# Patient Record
Sex: Male | Born: 1995 | Race: Black or African American | Hispanic: No | Marital: Single | Smoking: Never smoker
Health system: Southern US, Community
[De-identification: ages and names within clinical notes are randomized; demographics above are authoritative.]

## PROBLEM LIST (undated history)

## (undated) HISTORY — PX: KNEE SURGERY: SHX244

---

## 2016-01-20 ENCOUNTER — Emergency Department (HOSPITAL_COMMUNITY)
Admission: EM | Admit: 2016-01-20 | Discharge: 2016-01-20 | Disposition: A | Discharge status: Home / Self Care | Attending: Emergency Medicine | Admitting: Emergency Medicine

## 2016-01-20 ENCOUNTER — Encounter (HOSPITAL_COMMUNITY): Payer: Self-pay | Admitting: *Deleted

## 2016-01-20 ENCOUNTER — Emergency Department (HOSPITAL_COMMUNITY)

## 2016-01-20 DIAGNOSIS — Y999 Unspecified external cause status: Secondary | ICD-10-CM | POA: Diagnosis not present

## 2016-01-20 DIAGNOSIS — Y9361 Activity, american tackle football: Secondary | ICD-10-CM | POA: Insufficient documentation

## 2016-01-20 DIAGNOSIS — S8991XA Unspecified injury of right lower leg, initial encounter: Secondary | ICD-10-CM | POA: Diagnosis not present

## 2016-01-20 DIAGNOSIS — X509XXA Other and unspecified overexertion or strenuous movements or postures, initial encounter: Secondary | ICD-10-CM | POA: Insufficient documentation

## 2016-01-20 DIAGNOSIS — Y929 Unspecified place or not applicable: Secondary | ICD-10-CM | POA: Insufficient documentation

## 2016-01-20 MED ORDER — DICLOFENAC SODIUM 50 MG PO TBEC: 50.0000 mg | DELAYED_RELEASE_TABLET | Freq: Two times a day (BID) | ORAL | 0 refills | Status: AC

## 2016-01-20 MED ORDER — HYDROCODONE-ACETAMINOPHEN 5-325 MG PO TABS: 1.0000 | ORAL_TABLET | Freq: Four times a day (QID) | ORAL | 0 refills | Status: AC | PRN

## 2016-01-20 MED ORDER — HYDROCODONE-ACETAMINOPHEN 5-325 MG PO TABS
1.0000 | ORAL_TABLET | Freq: Once | ORAL | Status: AC
  Administered 2016-01-20: 1 via ORAL
  Filled 2016-01-20: qty 1

## 2016-01-20 NOTE — ED Notes (Signed)
Pt stable, ambulatory, states understanding of discharge instructions 

## 2016-01-20 NOTE — ED Provider Notes (Signed)
[facility]-EMERGENCY DEPT Provider Note   CSN: [id number] Arrival date & time: 01/20/16  2034  By signing my name below, I, Phillis HaggisGabriella Gaje, attest that this documentation has been prepared under the direction and in the presence of [facility] Neese, NP-C. Electronically Signed: Phillis HaggisGabriella Gaje, ED Scribe. 01/20/16. 9:12 PM.  History   Chief Complaint Chief Complaint  Patient presents with  . Knee Pain   The history is provided by the patient. No language interpreter was used.  Knee Pain   This is a new problem. The current episode started 3 to 5 hours ago. The problem occurs constantly. The problem has been gradually worsening. The pain is present in the right knee. The pain is mild. Associated symptoms include limited range of motion. Pertinent negatives include no numbness. He has tried nothing for the symptoms.   HPI Comments: Kyle Holt is a 20 y.o. male who presents to the Emergency Department complaining of right knee pain onset earlier today. Pt says that he "did a spin move" while playing football and felt a pop. He is unable to bear weight on the leg. He reports recent meniscus and ACL tears of the right knee. He denies color change, wound, numbness, or weakness.  History reviewed. No pertinent past medical history.  There are no active problems to display for this patient.   History reviewed. No pertinent surgical history.    Home Medications    Prior to Admission medications   Medication Sig Start Date End Date Taking? Authorizing Provider  diclofenac (VOLTAREN) 50 MG EC tablet Take 1 tablet (50 mg total) by mouth 2 (two) times daily. 01/20/16   Hope Orlene OchM Neese, NP  HYDROcodone-acetaminophen (NORCO) 5-325 MG tablet Take 1 tablet by mouth every 6 (six) hours as needed. 01/20/16   Hope Orlene OchM Neese, NP    Family History No family history on file.  Social History Social History  Substance Use Topics  . Smoking status: Never Smoker  . Smokeless tobacco: Never Used  . Alcohol use No       Allergies   Patient has no known allergies.   Review of Systems Review of Systems  Musculoskeletal: Positive for arthralgias and joint swelling.  Skin: Negative for color change and wound.  Neurological: Negative for weakness and numbness.   Physical Exam Updated Vital Signs BP 115/62 (BP Location: Right Arm)   Pulse (!) 59   Temp 97.7 F (36.5 C)   Resp 17   Ht 6' (1.829 m)   Wt 68 kg   SpO2 100%   BMI 20.34 kg/m   Physical Exam  Constitutional: He is oriented to person, place, and time. He appears well-developed and well-nourished. No distress.  HENT:  Head: Normocephalic and atraumatic.  Mouth/Throat: Oropharynx is clear and moist. No oropharyngeal exudate.  Eyes: Conjunctivae and EOM are normal. Pupils are equal, round, and reactive to light.  Neck: Normal range of motion. Neck supple.  Musculoskeletal:  Distal pulses are 2+; high riding patella noted to right knee  Neurological: He is alert and oriented to person, place, and time.  Skin: Skin is warm and dry.  Psychiatric: He has a normal mood and affect. His behavior is normal.   ED Treatments / Results  DIAGNOSTIC STUDIES: Oxygen Saturation is 97% on RA, normal by my interpretation.    COORDINATION OF CARE: 9:09 PM-Discussed treatment plan which includes x-ray with pt at bedside and pt agreed to plan.    Labs (all labs ordered are listed, but only abnormal results  are displayed) Labs Reviewed - No data to display  EKG  EKG Interpretation None       Radiology Dg Knee Complete 4 Views Right  Result Date: 01/20/2016 CLINICAL DATA:  Injury while playing football, with pop at the right knee. Unable to bear weight at the right knee. Recent meniscal tear and ACL tear. Initial encounter. EXAM: RIGHT KNEE - COMPLETE 4+ VIEW COMPARISON:  None. FINDINGS: There is no evidence of fracture or dislocation. The joint spaces are preserved. No significant degenerative change is seen; the patellofemoral joint is  grossly unremarkable in appearance. No significant joint effusion is seen. The visualized soft tissues are normal in appearance. IMPRESSION: No evidence of fracture or dislocation. Given the patient's symptoms and presentation, MRI would be helpful for further evaluation, to assess for underlying internal derangement. Electronically Signed   By: Roanna RaiderJeffery  Chang M.D.   On: 01/20/2016 21:54    Procedures Procedures (including critical care time)  Medications Ordered in ED Medications  HYDROcodone-acetaminophen (NORCO/VICODIN) 5-325 MG per tablet 1 tablet (1 tablet Oral Given 01/20/16 2116)     Initial Impression / Assessment and Plan / ED Course  I have reviewed the triage vital signs and the nursing notes.  Pertinent imaging results that were available during my care of the patient were reviewed by me and considered in my medical decision making (see chart for details).  Clinical Course   Dr. Effie ShyWentz in to examine the patient. Will apply ace wrap, knee immobilizer and patient will use crutches and f/u with ortho ASAP.   Patient X-Ray negative for obvious fracture or dislocation. Pain managed in ED. Pt advised to follow up with orthopedics. Referral given. Patient given brace while in ED, conservative therapy recommended and discussed. Patient will be dc home & is agreeable with above plan.  Final Clinical Impressions(s) / ED Diagnoses   Final diagnoses:  Knee injury, right, initial encounter  I personally performed the services described in this documentation, which was scribed in my presence. The recorded information has been reviewed and is accurate.   New Prescriptions Discharge Medication List as of 01/20/2016 10:25 PM    START taking these medications   Details  diclofenac (VOLTAREN) 50 MG EC tablet Take 1 tablet (50 mg total) by mouth 2 (two) times daily., Starting Thu 01/20/2016, Print    HYDROcodone-acetaminophen [facility](NORCO) 5-325 MG tablet Take 1 tablet by mouth every 6 (six)  hours as needed., Starting Thu 01/20/2016, Print         ParksHope M Neese, NP 01/21/16 1636    Mancel BaleElliott Wentz, MD 01/21/16 2316

## 2016-01-20 NOTE — ED Triage Notes (Signed)
Pt reports a recent meniscus and ACL tear to R knee. Pt was playing football today, did a "spin move," felt a pop and has been unable to bear weight to R knee since incident.

## 2016-01-21 NOTE — ED Provider Notes (Signed)
  Face-to-face evaluation   History: Right after planted pivot in ball game  Physical exam: Resists right knee extension, secondary to pain. Somewhat soft but palpable patellar tendon. Right knee is grossly stable.  Medical screening examination/treatment/procedure(s) were conducted as a shared visit with non-physician practitioner(s) and myself.  I personally evaluated the patient during the encounter   Mancel BaleElliott Jovany Disano, MD 01/21/16 2316

## 2016-02-02 ENCOUNTER — Other Ambulatory Visit: Payer: Self-pay | Admitting: Orthopaedic Surgery

## 2016-02-02 DIAGNOSIS — M25561 Pain in right knee: Secondary | ICD-10-CM

## 2016-02-06 ENCOUNTER — Ambulatory Visit
Admission: RE | Admit: 2016-02-06 | Discharge: 2016-02-06 | Disposition: A | Discharge status: Home / Self Care | Source: Ambulatory Visit | Attending: Orthopaedic Surgery | Admitting: Orthopaedic Surgery

## 2016-02-06 DIAGNOSIS — M25561 Pain in right knee: Secondary | ICD-10-CM

## 2016-07-06 ENCOUNTER — Encounter (HOSPITAL_COMMUNITY): Payer: Self-pay | Admitting: *Deleted

## 2016-07-06 DIAGNOSIS — S6991XA Unspecified injury of right wrist, hand and finger(s), initial encounter: Secondary | ICD-10-CM | POA: Diagnosis present

## 2016-07-06 DIAGNOSIS — Z79899 Other long term (current) drug therapy: Secondary | ICD-10-CM | POA: Diagnosis not present

## 2016-07-06 DIAGNOSIS — Y939 Activity, unspecified: Secondary | ICD-10-CM | POA: Diagnosis not present

## 2016-07-06 DIAGNOSIS — Y999 Unspecified external cause status: Secondary | ICD-10-CM | POA: Insufficient documentation

## 2016-07-06 DIAGNOSIS — W260XXA Contact with knife, initial encounter: Secondary | ICD-10-CM | POA: Diagnosis not present

## 2016-07-06 DIAGNOSIS — Y929 Unspecified place or not applicable: Secondary | ICD-10-CM | POA: Insufficient documentation

## 2016-07-06 DIAGNOSIS — S61212A Laceration without foreign body of right middle finger without damage to nail, initial encounter: Secondary | ICD-10-CM | POA: Diagnosis not present

## 2016-07-06 NOTE — ED Triage Notes (Signed)
Pt was cutting a sandwich with a kitchen knife and cut the pad of his right hand third finger. Pressure dressing applied in triage.

## 2016-07-07 ENCOUNTER — Emergency Department (HOSPITAL_COMMUNITY)
Admission: EM | Admit: 2016-07-07 | Discharge: 2016-07-07 | Disposition: A | Discharge status: Home / Self Care | Attending: Emergency Medicine | Admitting: Emergency Medicine

## 2016-07-07 DIAGNOSIS — S61212A Laceration without foreign body of right middle finger without damage to nail, initial encounter: Secondary | ICD-10-CM

## 2016-07-07 MED ORDER — LIDOCAINE HCL (PF) 1 % IJ SOLN
2.0000 mL | Freq: Once | INTRAMUSCULAR | Status: AC
  Administered 2016-07-07: 2 mL
  Filled 2016-07-07: qty 5

## 2016-07-07 NOTE — ED Provider Notes (Signed)
[facility]EM[facility]Y DEPT Provider Note   CSN: [id number] Arrival date & time: 07/06/16  2333     History   Chief Complaint Chief Complaint  Patient presents with  . Extremity Laceration    HPI Kyle Holt is a 21 y.o. male who presents to the ED with a laceration to the right middle finger approximately 3 hours ago. He reports he was using a knife to prepare something to eat and it slipped causing the laceration. UTD on tetanus.   HPI  History reviewed. No pertinent past medical history.  There are no active problems to display for this patient.   Past Surgical History:  Procedure Laterality Date  . KNEE SURGERY         Home Medications    Prior to Admission medications   Medication Sig Start Date End Date Taking? Authorizing Provider  diclofenac (VOLTAREN) 50 MG EC tablet Take 1 tablet (50 mg total) by mouth 2 (two) times daily. 01/20/16   D[place] Orlene Och, NP  HYDROcodone-acetaminophen (NORCO) 5-325 MG tablet Take 1 tablet by mouth every 6 (six) hours as needed. 01/20/16   Francheska Villeda Orlene Och, NP    Family History No family history on file.  Social History Social History  Substance Use Topics  . Smoking status: Never Smoker  . Smokeless tobacco: Never Used  . Alcohol use No     Allergies   Patient has no known allergies.   Review of Systems Review of Systems  Musculoskeletal: Positive for arthralgias.       Right middle finger  Skin: Positive for wound.  Neurological: Negative for syncope, weakness and numbness.     Physical Exam Updated Vital Signs BP 127/64 (BP Location: Left Arm)   Pulse 80   Temp 98.4 F (36.9 C) (Oral)   Resp 16   SpO2 98%   Physical Exam  Constitutional: He is oriented to person, place, and time. He appears well-developed and well-nourished. No distress.  HENT:  Head: Normocephalic.  Eyes: EOM are normal.  Neck: Neck supple.  Cardiovascular: Normal rate.   Pulmonary/Chest: Effort normal.  Musculoskeletal: Normal range of  motion.  See skin exam  Neurological: He is alert and oriented to person, place, and time. No cranial nerve deficit.  Skin: Skin is warm and dry.  Laceration to the right middle finger without nail involvement. Full range of motion, good strength and sensation.   Psychiatric: He has a normal mood and affect. His behavior is normal.  Nursing note and vitals reviewed.    ED Treatments / Results  Labs (all labs ordered are listed, but only abnormal results are displayed) Labs Reviewed - No data to display   Radiology No results found.  Procedures .Marland KitchenLaceration Repair Date/Time: 07/07/2016 1:22 AM Performed by: Janne Napoleon Authorized by: Janne Napoleon   Consent:    Consent obtained:  Verbal   Consent given by:  Patient   Risks discussed:  Infection and poor wound healing   Alternatives discussed:  No treatment Anesthesia (see MAR for exact dosages):    Anesthesia method:  Local infiltration   Local anesthetic:  Lidocaine 1% w/o epi Laceration details:    Location:  Finger   Finger location:  R long finger   Length (cm):  2 Repair type:    Repair type:  Simple Pre-procedure details:    Preparation:  Patient was prepped and draped in usual sterile fashion and imaging obtained to evaluate for foreign bodies Exploration:    Hemostasis achieved with:  Direct pressure   Contaminated: no   Treatment:    Area cleansed with:  Betadine   Amount of cleaning:  Standard   Irrigation solution:  Sterile saline   Irrigation method:  Syringe Skin repair:    Repair method:  Sutures   Suture size:  5-0   Suture material:  Prolene   Suture technique:  Simple interrupted   Number of sutures:  4 Approximation:    Approximation:  Close   Vermilion border: well-aligned   Post-procedure details:    Dressing:  Sterile dressing    (including critical care time)  Medications Ordered in ED Medications  lidocaine (PF) (XYLOCAINE) 1 % injection 2 mL (2 mLs Other Given 07/07/16 0112)      Initial Impression / Assessment and Plan / ED Course  I have reviewed the triage vital signs and the nursing notes.  Final Clinical Impressions(s) / ED Diagnoses  21 y.o. male with laceration to the right middle finger stable for d/c without focal neuro deficits. He will f/u for suture removal in one week. He will f/u sooner for any problems.  Final diagnoses:  Laceration of right middle finger without foreign body without damage to nail, initial encounter    New Prescriptions New Prescriptions   No medications on file     [facility] M Verlin Duke, NP 07/07/16 0148    Zadie Rhineonald Wickline, MD 07/08/16 [phone]

## 2017-03-21 ENCOUNTER — Emergency Department (HOSPITAL_BASED_OUTPATIENT_CLINIC_OR_DEPARTMENT_OTHER)
Admission: EM | Admit: 2017-03-21 | Discharge: 2017-03-21 | Disposition: A | Discharge status: Home / Self Care | Payer: No Typology Code available for payment source | Attending: Emergency Medicine | Admitting: Emergency Medicine

## 2017-03-21 ENCOUNTER — Emergency Department (HOSPITAL_COMMUNITY)
Admission: EM | Admit: 2017-03-21 | Discharge: 2017-03-21 | Discharge status: Against Medical Advice | Attending: Emergency Medicine | Admitting: Emergency Medicine

## 2017-03-21 ENCOUNTER — Encounter (HOSPITAL_COMMUNITY): Payer: Self-pay | Admitting: Emergency Medicine

## 2017-03-21 ENCOUNTER — Other Ambulatory Visit: Payer: Self-pay

## 2017-03-21 ENCOUNTER — Encounter (HOSPITAL_BASED_OUTPATIENT_CLINIC_OR_DEPARTMENT_OTHER): Payer: Self-pay

## 2017-03-21 DIAGNOSIS — Y999 Unspecified external cause status: Secondary | ICD-10-CM | POA: Diagnosis not present

## 2017-03-21 DIAGNOSIS — S0990XA Unspecified injury of head, initial encounter: Secondary | ICD-10-CM | POA: Insufficient documentation

## 2017-03-21 DIAGNOSIS — Z79899 Other long term (current) drug therapy: Secondary | ICD-10-CM | POA: Diagnosis not present

## 2017-03-21 DIAGNOSIS — S161XXA Strain of muscle, fascia and tendon at neck level, initial encounter: Secondary | ICD-10-CM | POA: Insufficient documentation

## 2017-03-21 DIAGNOSIS — Y92410 Unspecified street and highway as the place of occurrence of the external cause: Secondary | ICD-10-CM | POA: Diagnosis not present

## 2017-03-21 DIAGNOSIS — Z5321 Procedure and treatment not carried out due to patient leaving prior to being seen by health care provider: Secondary | ICD-10-CM | POA: Diagnosis not present

## 2017-03-21 DIAGNOSIS — Y9389 Activity, other specified: Secondary | ICD-10-CM | POA: Diagnosis not present

## 2017-03-21 DIAGNOSIS — Z041 Encounter for examination and observation following transport accident: Secondary | ICD-10-CM | POA: Diagnosis present

## 2017-03-21 MED ORDER — NAPROXEN 500 MG PO TABS: 500.0000 mg | ORAL_TABLET | Freq: Two times a day (BID) | ORAL | 0 refills | Status: AC | PRN

## 2017-03-21 MED ORDER — CYCLOBENZAPRINE HCL 5 MG PO TABS: 5.0000 mg | ORAL_TABLET | Freq: Three times a day (TID) | ORAL | 0 refills | Status: AC | PRN

## 2017-03-21 NOTE — ED Notes (Signed)
NAD at this time. Pt is stable and going home.  
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
## 2017-03-21 NOTE — ED Notes (Signed)
Per patient request, spoke with patient's mother on the phone. Mother is upset patient is waiting in the lobby. Explained to mother patient will be triaged and placed accordingly after triage.
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
## 2017-03-21 NOTE — Discharge Instructions (Signed)
It was my pleasure taking care of you today!   Naproxen as needed for pain. Flexeril is your muscle relaxer to take as needed.   Rest, ice on head can help with headache.  Stay in a quiet, non-simulating, dark environment. No TV, computer use, video games until headache is resolved completely. Follow up with your primary care physician if headache persists.  Return to the emergency department for vomiting, change in mental status, new or worsening symptoms, any additional concerns.

## 2017-05-15 IMAGING — DX DG KNEE COMPLETE 4+V*R*
4 series · 4 of 4 positions shown · non-contrast
Comparison: None.

CLINICAL DATA: Injury while playing football, with pop at the right
knee. Unable to bear weight at the right knee. Recent meniscal tear
and ACL tear. Initial encounter.

EXAM:
RIGHT KNEE - COMPLETE 4+ VIEW

[knee ap]
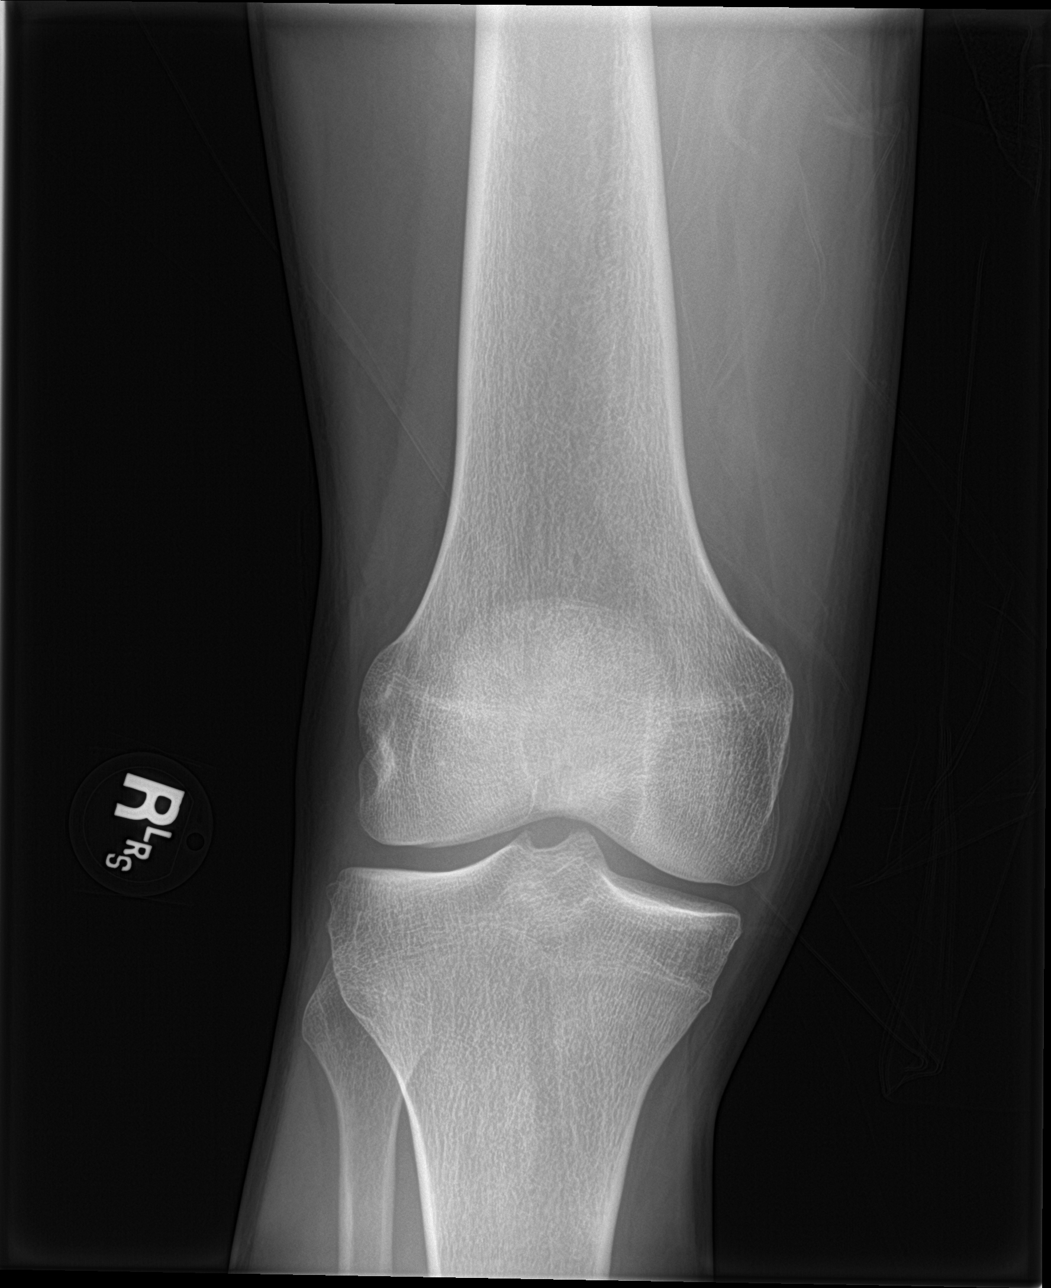

[tunnel]
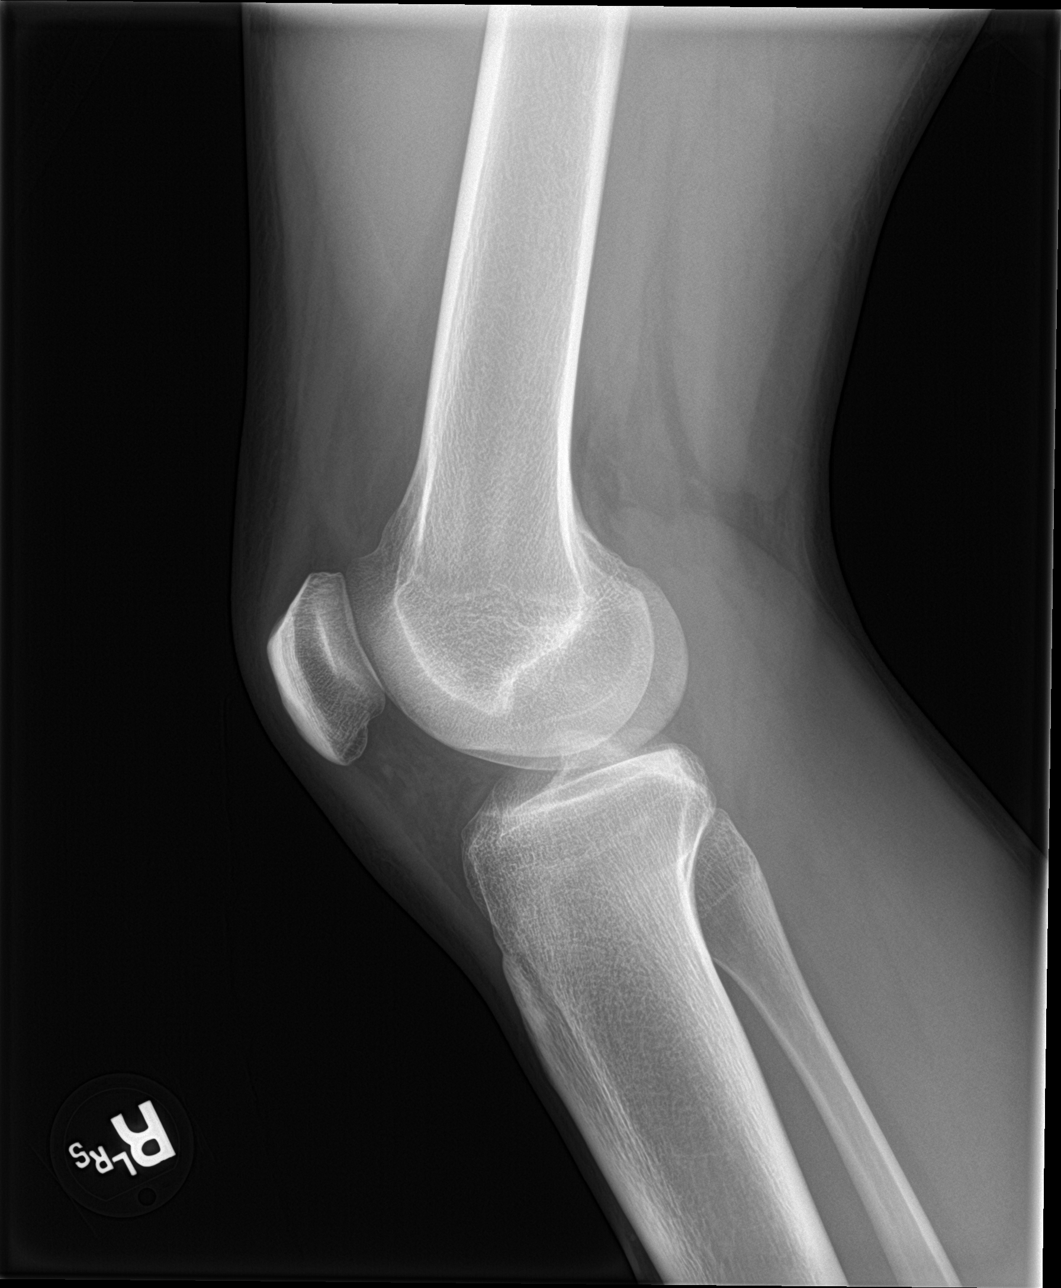

[knee obl (1 of 2)]
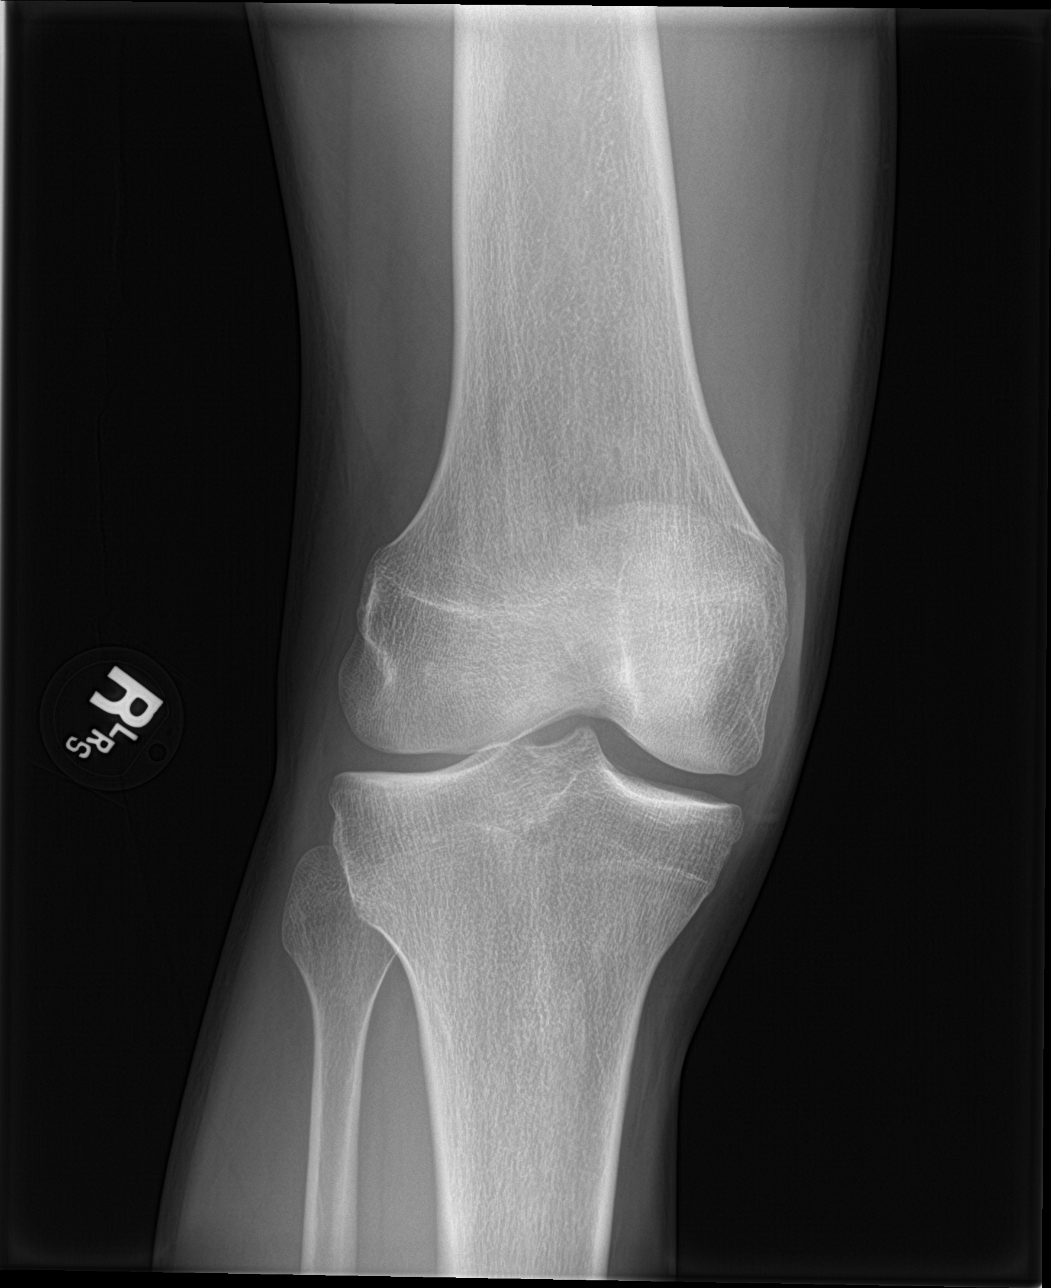

[knee obl (2 of 2)]
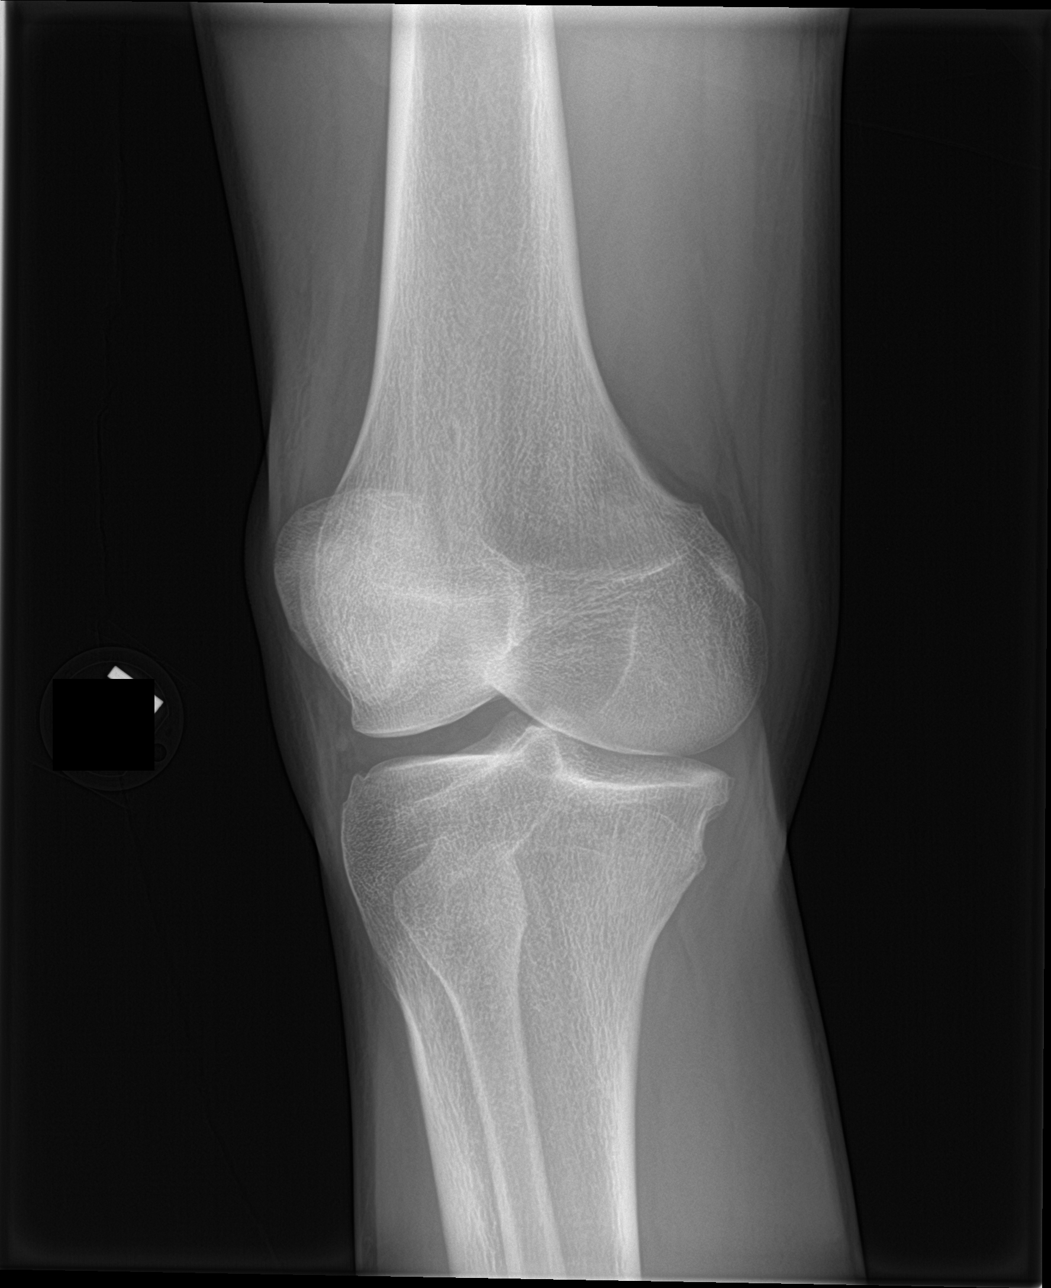

[4 of 4 positions shown; findings below may reference images not displayed]

FINDINGS: There is no evidence of fracture or dislocation. The joint spaces
are preserved. No significant degenerative change is seen; the
patellofemoral joint is grossly unremarkable in appearance.

No significant joint effusion is seen. The visualized soft tissues
are normal in appearance.
IMPRESSION: No evidence of fracture or dislocation. Given the patient's symptoms
and presentation, MRI would be helpful for further evaluation, to
assess for underlying internal derangement.
